# Patient Record
Sex: Male | Born: 1985 | Hispanic: Yes | Marital: Single
Health system: Southern US, Community
[De-identification: ages and names within clinical notes are randomized; demographics above are authoritative.]

---

## 2014-10-18 ENCOUNTER — Other Ambulatory Visit (HOSPITAL_COMMUNITY): Payer: Self-pay | Admitting: Chiropractic Medicine

## 2014-10-18 ENCOUNTER — Ambulatory Visit (HOSPITAL_COMMUNITY)
Admission: RE | Admit: 2014-10-18 | Discharge: 2014-10-18 | Disposition: A | Discharge status: Home / Self Care | Payer: No Typology Code available for payment source | Source: Ambulatory Visit | Attending: Chiropractic Medicine | Admitting: Chiropractic Medicine

## 2014-10-18 DIAGNOSIS — M542 Cervicalgia: Secondary | ICD-10-CM | POA: Diagnosis not present

## 2014-10-18 DIAGNOSIS — M545 Low back pain: Secondary | ICD-10-CM | POA: Insufficient documentation
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
## 2019-06-08 ENCOUNTER — Other Ambulatory Visit: Payer: Self-pay

## 2019-06-08 DIAGNOSIS — Z20822 Contact with and (suspected) exposure to covid-19: Secondary | ICD-10-CM

## 2019-06-10 LAB — NOVEL CORONAVIRUS, NAA: SARS-CoV-2, NAA: DETECTED — AB

## 2019-10-24 ENCOUNTER — Ambulatory Visit: Payer: Self-pay | Attending: Internal Medicine

## 2019-10-24 DIAGNOSIS — Z23 Encounter for immunization: Secondary | ICD-10-CM

## 2019-10-24 NOTE — Progress Notes (Signed)
   Covid-[facility] Vaccination [facility]  Name:  Terique Kawabata    MRN: [id number] DOB: 07-Aug-1985  10/24/2019  Mr. Castro-Godinez was observed post Covid-19 immunization for 15 minutes without incident. He was provided with Vaccine Information Sheet and instruction to access the V-Safe system.   Mr. Glatfelter was instructed to call 911 with any severe reactions post vaccine: Marland Kitchen Difficulty breathing  . Swelling of face and throat  . A fast heartbeat  . A bad rash all over body  . Dizziness and weakness   Immunizations Administered    Name Date Dose VIS Date Route   Pfizer COVID-19 Vaccine 10/24/2019  4:43 PM 0.3 mL 07/16/2019 Intramuscular   Manufacturer: [employer], [employer]   Lot: [id number]   NDC: [address]-0

## 2019-11-14 ENCOUNTER — Ambulatory Visit: Payer: Self-pay | Attending: Internal Medicine

## 2019-11-14 DIAGNOSIS — Z23 Encounter for immunization: Secondary | ICD-10-CM

## 2019-11-14 NOTE — Progress Notes (Signed)
   Covid-19 Vaccination Clinic  Name:  Trajon Rosete    MRN: [id number] DOB: [birth year]  11/14/2019  Mr. Castro-Godinez was observed post Covid-19 immunization for 15 minutes   without incident. He was provided with Vaccine Information Sheet and instruction to access the V-Safe system.   Mr. Pardon was instructed to call 911 with any severe reactions post vaccine: Marland Kitchen Difficulty breathing  . Swelling of face and throat  . A fast heartbeat  . A bad rash all over body  . Dizziness and weakness   Immunizations Administered    Name Date Dose VIS Date Route   Pfizer COVID-19 Vaccine 11/14/2019  3:51 PM 0.3 mL 07/16/2019 Intramuscular   Manufacturer: [employer], [employer]   Lot: [id number]   NDC: [address]-5
